# Patient Record
Sex: Female | Born: 1965 | Race: White | Hispanic: No | Marital: Married
Health system: Midwestern US, Academic
[De-identification: ages and names within clinical notes are randomized; demographics above are authoritative.]

---

## 2020-03-10 IMAGING — MR Head^Brain
9 of 10 series · 44 of 48 positions shown · non-contrast
Comparison: none

[Series 2: T1 fat-sat post-contrast · coronal · 3.0mm · 0.82mm/px · 3 of 24 slices shown (1 of 3)]
[im 1/24]
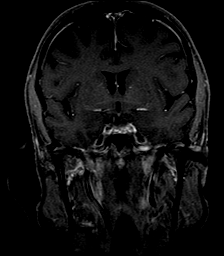
[im 12/24]
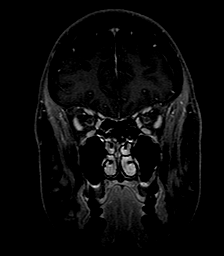
[im 24/24]
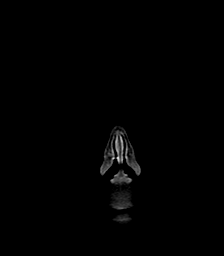

[Series 3: T1 fat-sat post-contrast · axial · 3.0mm · 0.35mm/px · z∈[-26,+26]mm · 3 of 17 slices shown (2 of 3)]
[im 1/17]
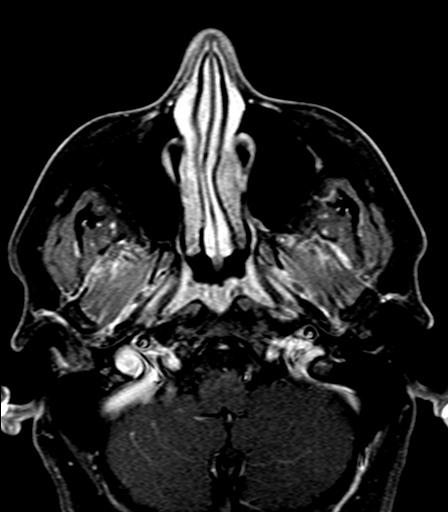
[im 9/17]
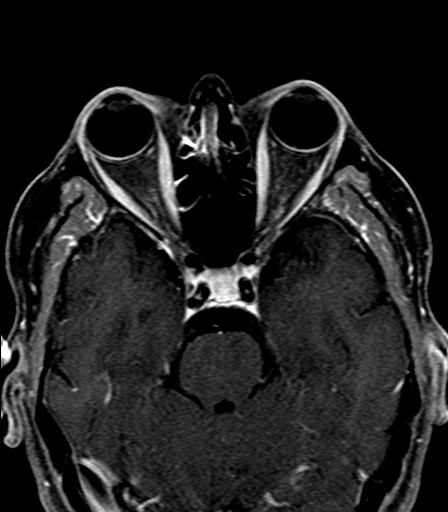
[im 17/17]
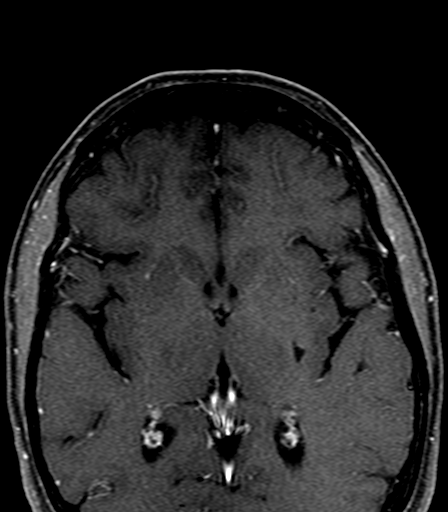

[Series 4: (id)_(person_name)_(person_name) · axial · 1.0mm · 0.39mm/px · z∈[-27,+24]mm · 8 of 52 slices shown]
[im 1/52]
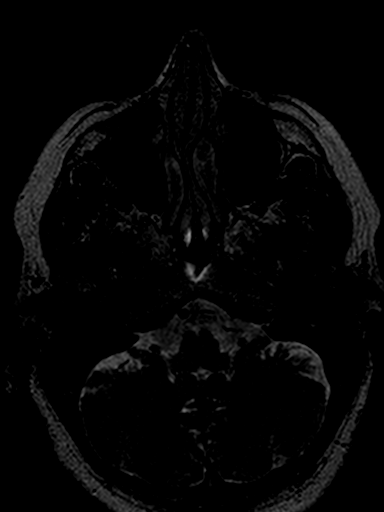
[im 8/52]
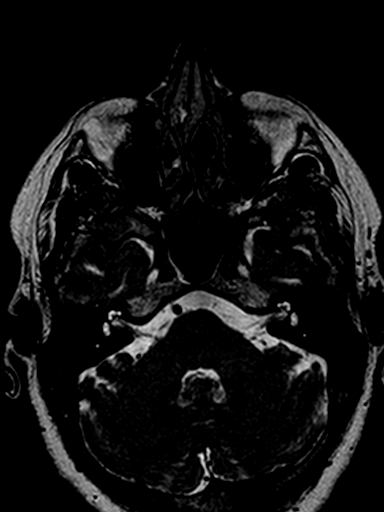
[im 15/52]
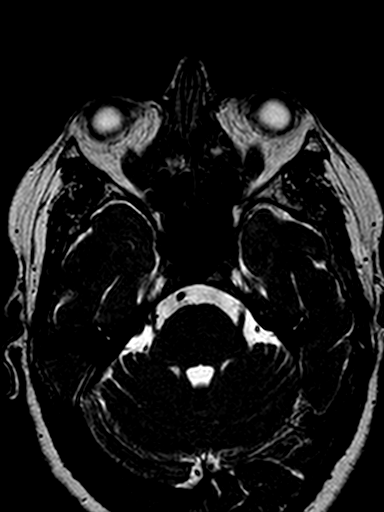
[im 22/52]
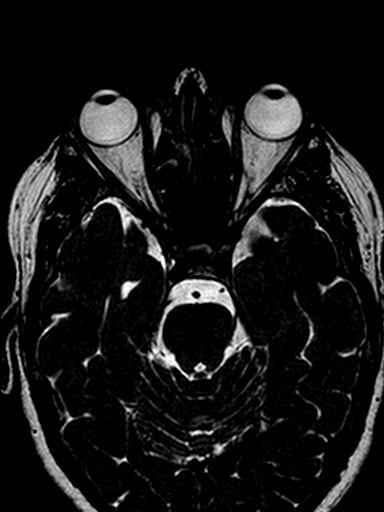
[im 30/52]
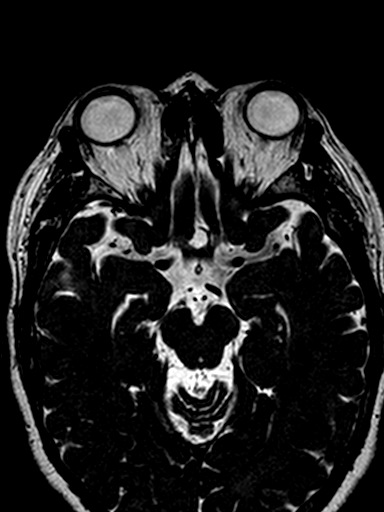
[im 37/52]
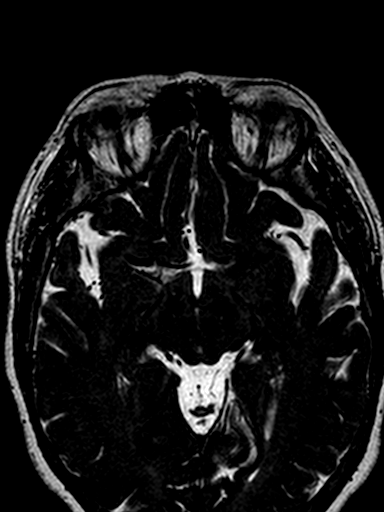
[im 44/52]
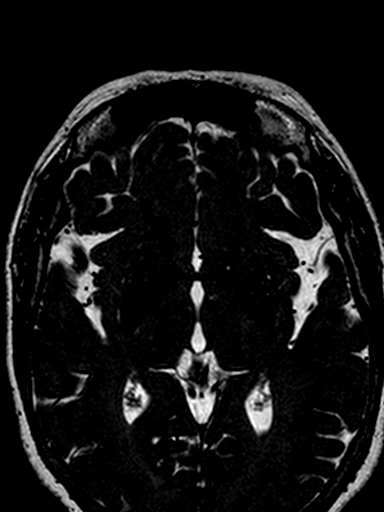
[im 52/52]
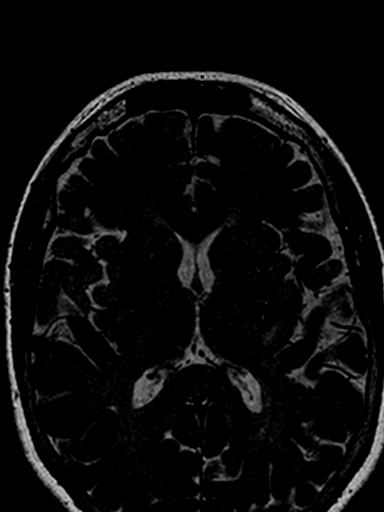

[Series 5: t1_(person_name)_(person_name)_3mm · axial · 3.0mm · 0.35mm/px · z∈[-24,+28]mm · 3 of 17 slices shown]
[im 1/17]
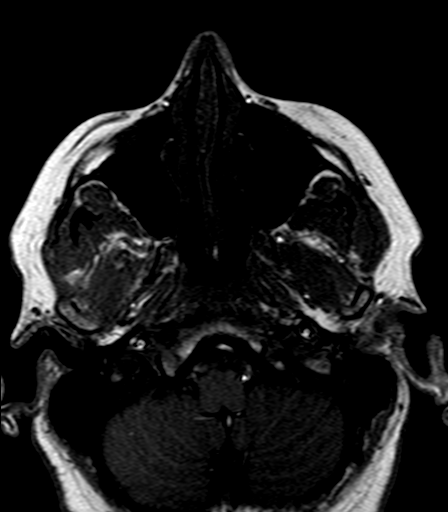
[im 9/17]
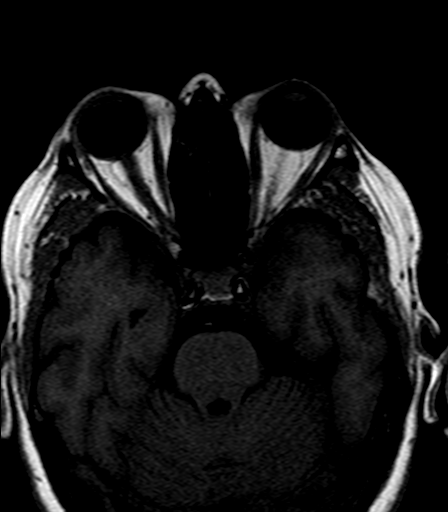
[im 17/17]
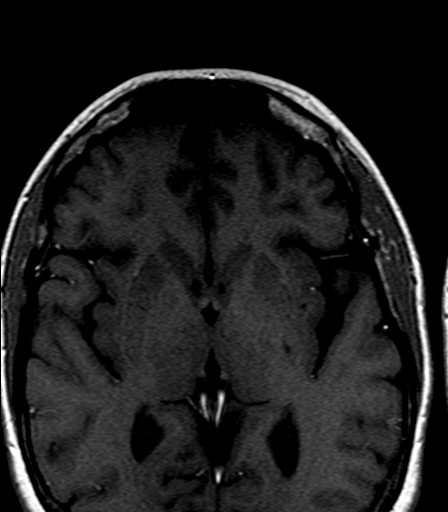

[Series 7: T2 · axial · 5.0mm · 0.72mm/px · z∈[-57,+91]mm · 4 of 24 slices shown]
[im 1/24]
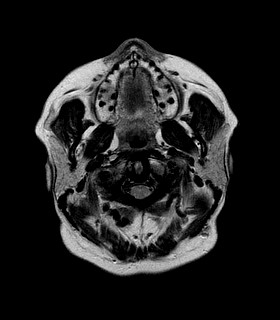
[im 8/24]
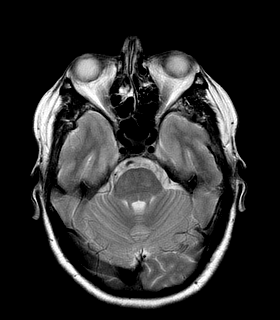
[im 16/24]
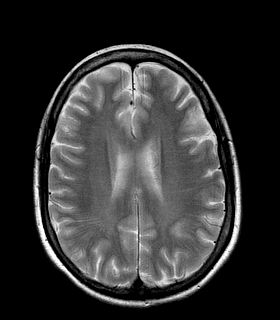
[im 24/24]
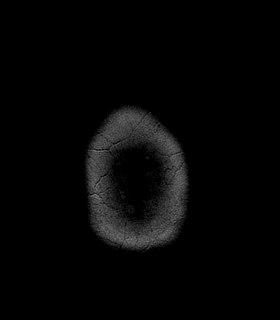

[Series 8: FLAIR · axial · 5.0mm · 0.45mm/px · z∈[-57,+91]mm · 4 of 24 slices shown]
[im 1/24]
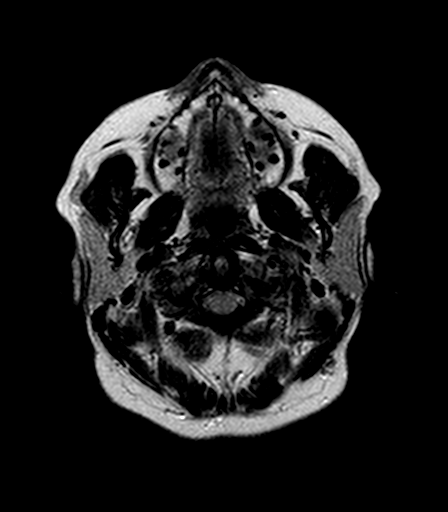
[im 8/24]
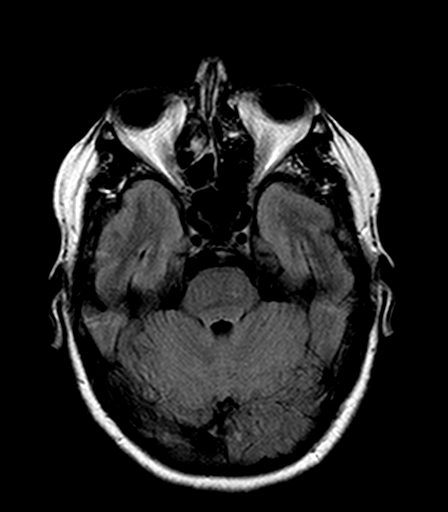
[im 16/24]
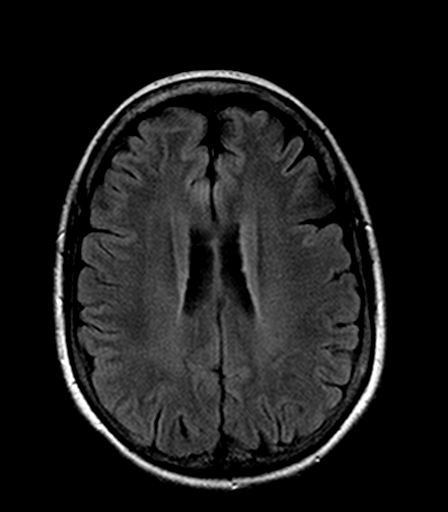
[im 24/24]
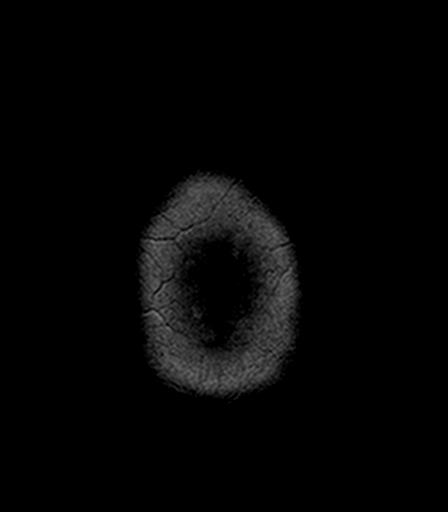

[Series 9: DWI · axial · 5.0mm · 1.80mm/px · z∈[-58,+91]mm · 4 of 24 slices shown (1 of 2)]
[im 1/24]
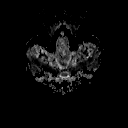
[im 8/24]
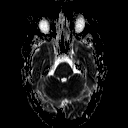
[im 16/24]
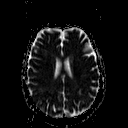
[im 24/24]
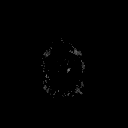

[Series 10: DWI · axial · 5.0mm · 1.80mm/px · z∈[-58,+91]mm · 11 of 70 slices shown (2 of 2)]
[im 1/70]
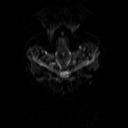
[im 7/70]
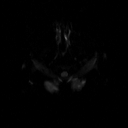
[im 14/70]
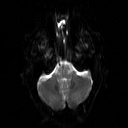
[im 21/70]
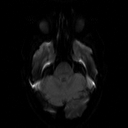
[im 28/70]
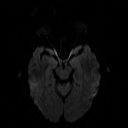
[im 35/70]
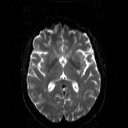
[im 42/70]
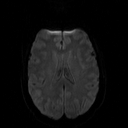
[im 49/70]
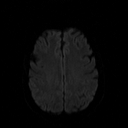
[im 56/70]
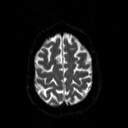
[im 63/70]
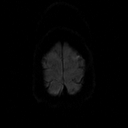
[im 70/70]
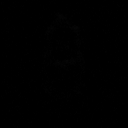

[Series 12: T1 fat-sat post-contrast · axial · 5.0mm · 0.90mm/px · z∈[-57,+91]mm · 4 of 24 slices shown (3 of 3)]
[im 1/24]
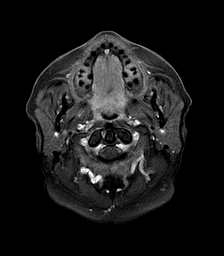
[im 8/24]
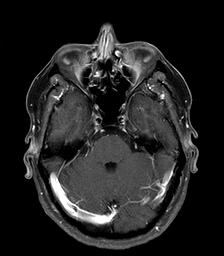
[im 16/24]
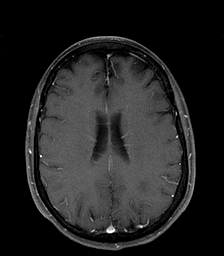
[im 24/24]
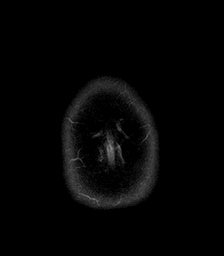

[44 of 48 positions shown; findings below may reference images not displayed]

03/10/20

EXAM

MRI orbits without and with contrast

INDICATION

papillaedema, patient has vision changes, pressure in eyes, bilateral orbits are effected, 15ml
gadavist

FINDINGS

Coronal and sagittal thin section pre and postcontrast T1 weighted images of the orbits were
obtained. Axial thin section T2 weighted images of the orbits were also obtained. Axial T2, FLAIR
and diffusion-weighted images of the brain were obtained. Axial postcontrast T1 weighted images of
the brain were obtained. An IV dose of 15 mL of Gadavist was administered.

The globes appear symmetric. The optic nerves are symmetric. No abnormal enhancement of the optic
nerves is identified.

The extraocular muscles appear symmetric. No retro bulb are mass lesion or focal abnormality is
seen within the orbits.

The optic chiasm is intact.

No enhancing lesions seen within the cerebral or cerebellar hemispheres. No focal abnormality of
the brainstem is identified.

There is no restricted diffusion.

There is moderate mucosal thickening involving the ethmoid sinuses.

IMPRESSION

No significant MRI abnormality orbits is identified.

Tech Notes:

papillaedema, patient has vision changes, pressure in eyes, bilateral orbits are effected, 15ml
gadavist

## 2020-03-10 IMAGING — MR Head^Brain
9 of 10 series · 41 of 48 positions shown · non-contrast
Comparison: none

[Series 2: T1 · sagittal · 5.0mm · 0.45mm/px · 4 of 20 slices shown (1 of 2)]
[im 1/20]
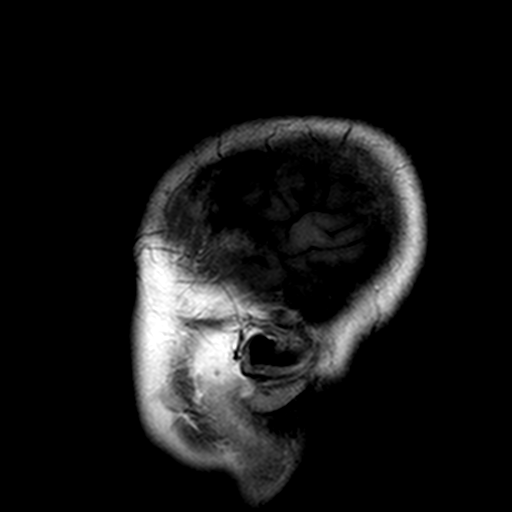
[im 7/20]
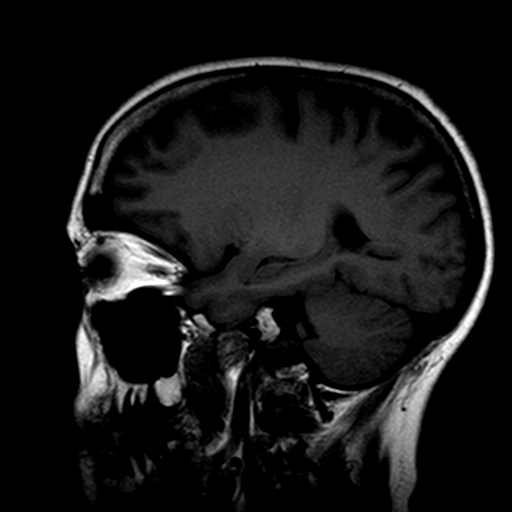
[im 13/20]
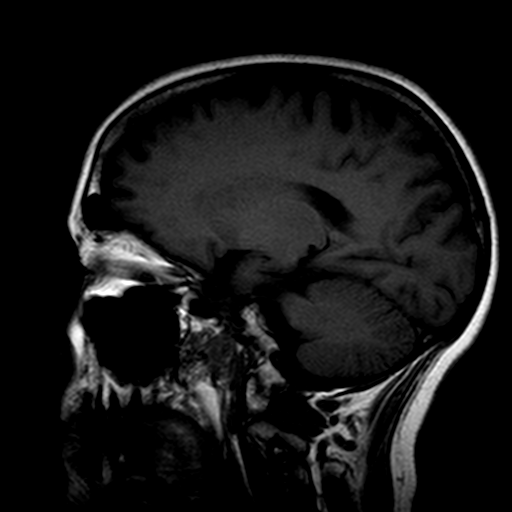
[im 20/20]
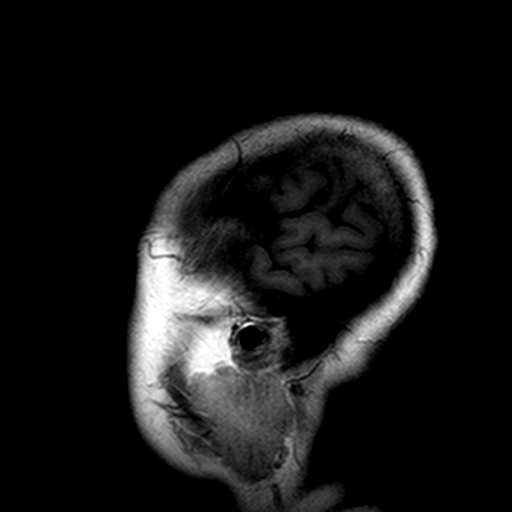

[Series 3: DWI · axial · 5.0mm · 1.80mm/px · z∈[-58,+91]mm · 9 of 70 slices shown (1 of 2)]
[im 1/70]
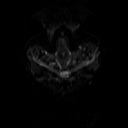
[im 13/70]
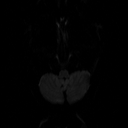
[im 19/70]
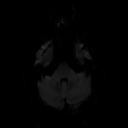
[im 32/70]
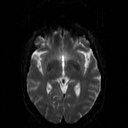
[im 38/70]
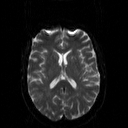
[im 51/70]
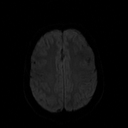
[im 57/70]
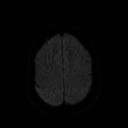
[im 63/70]
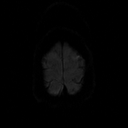
[im 70/70]
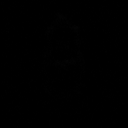

[Series 4: DWI · axial · 5.0mm · 1.80mm/px · z∈[-58,+91]mm · 4 of 24 slices shown (2 of 2)]
[im 1/24]
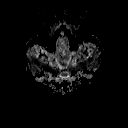
[im 8/24]
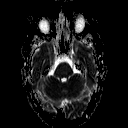
[im 16/24]
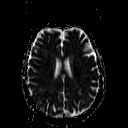
[im 24/24]
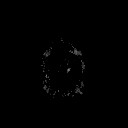

[Series 5: FLAIR · axial · 5.0mm · 0.45mm/px · z∈[-57,+91]mm · 4 of 24 slices shown]
[im 1/24]
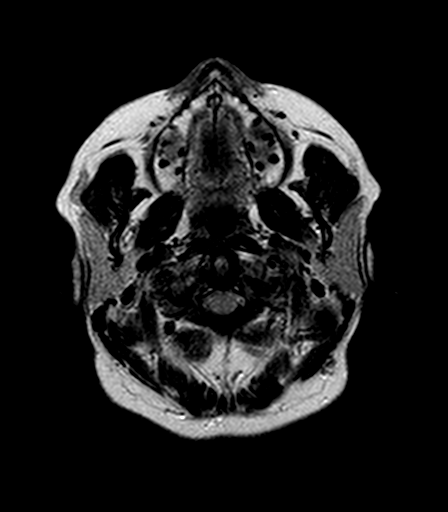
[im 8/24]
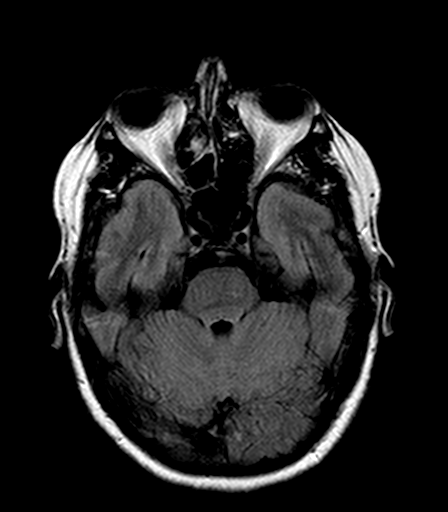
[im 16/24]
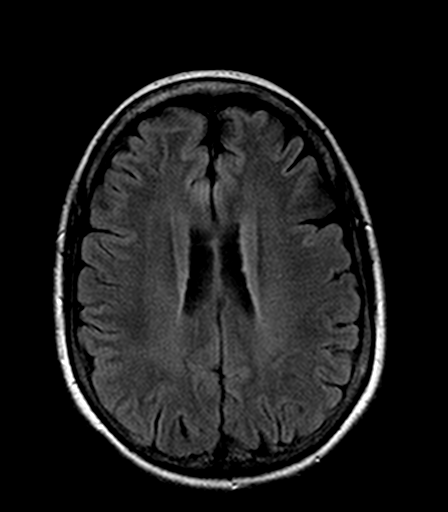
[im 24/24]
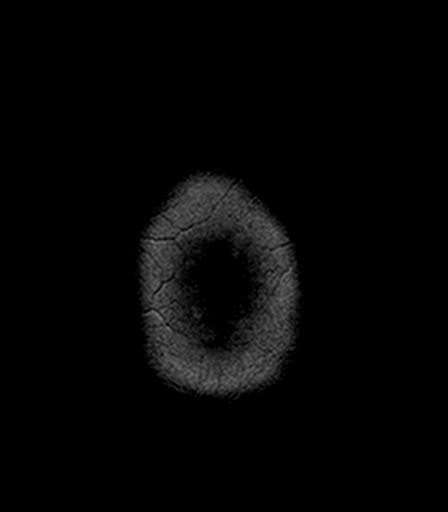

[Series 6: T2 · axial · 5.0mm · 0.72mm/px · z∈[-57,+91]mm · 4 of 24 slices shown]
[im 1/24]
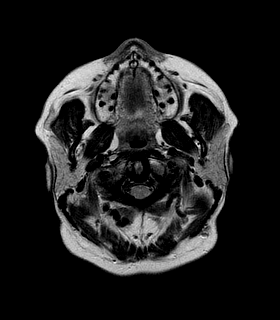
[im 8/24]
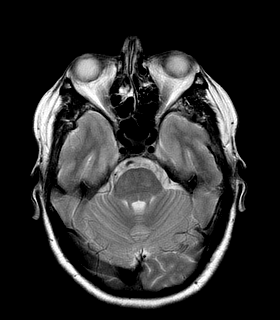
[im 16/24]
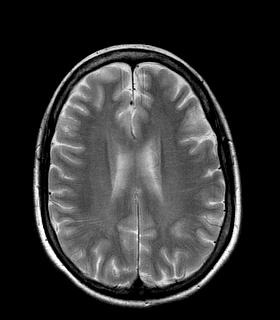
[im 24/24]
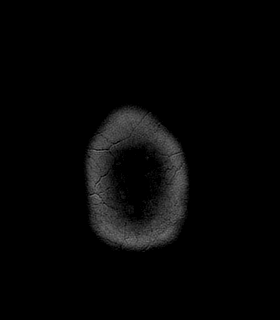

[Series 7: T1 · axial · 5.0mm · 0.45mm/px · z∈[-57,+91]mm · 4 of 24 slices shown (2 of 2)]
[im 1/24]
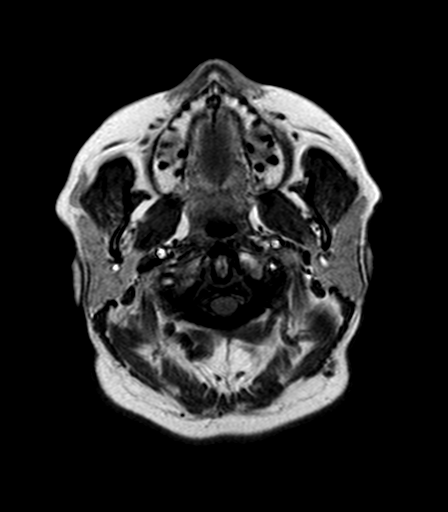
[im 8/24]
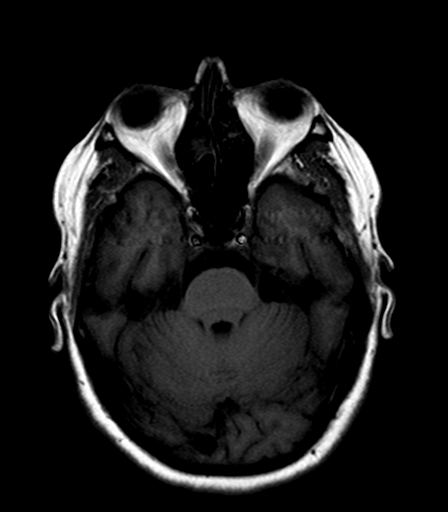
[im 16/24]
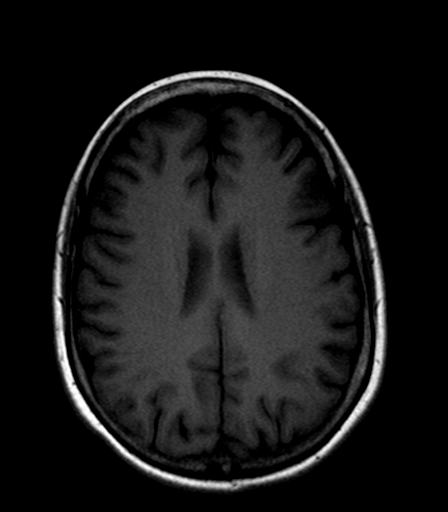
[im 24/24]
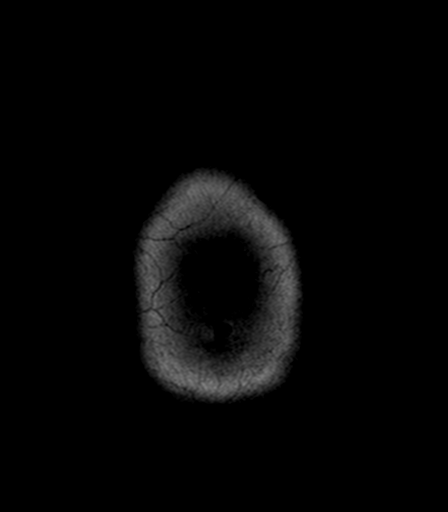

[Series 12: T1 fat-sat post-contrast · axial · 3.0mm · 0.35mm/px · z∈[-26,+26]mm · 3 of 17 slices shown (1 of 3)]
[im 1/17]
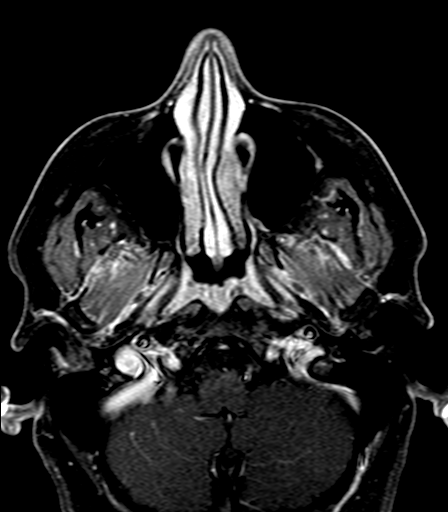
[im 9/17]
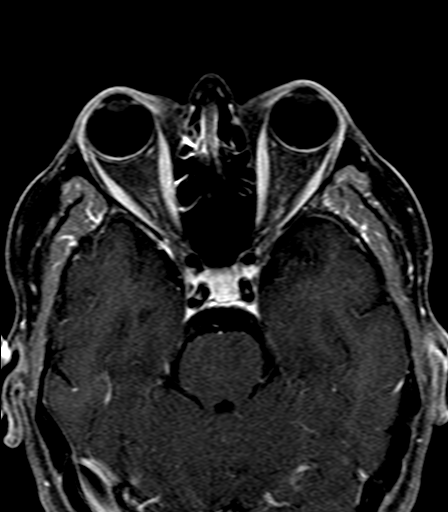
[im 17/17]
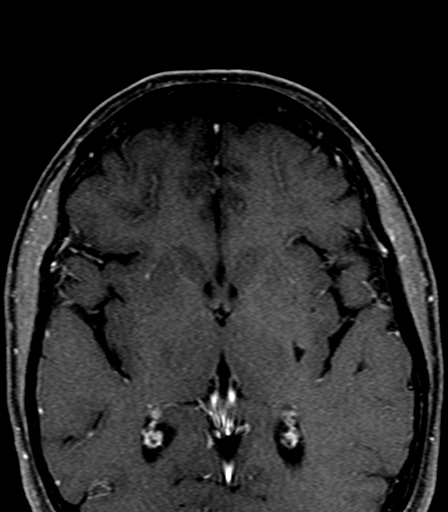

[Series 13: T1 fat-sat post-contrast · coronal · 3.0mm · 0.82mm/px · 4 of 24 slices shown (2 of 3)]
[im 1/24]
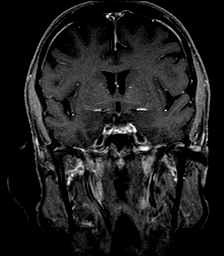
[im 8/24]
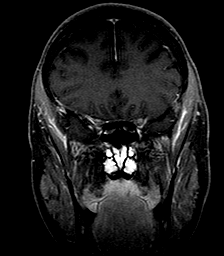
[im 16/24]
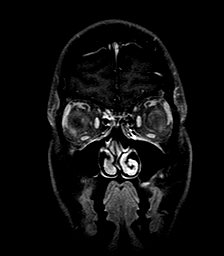
[im 24/24]
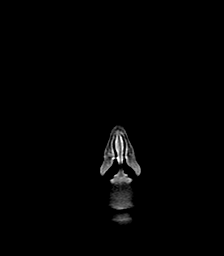

[Series 15: T1 fat-sat post-contrast · coronal · 5.0mm · 0.90mm/px · 5 of 30 slices shown (3 of 3)]
[im 1/30]
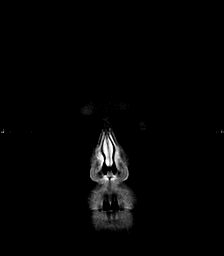
[im 8/30]
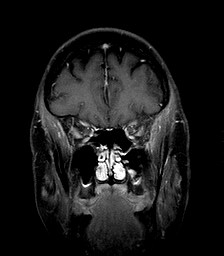
[im 15/30]
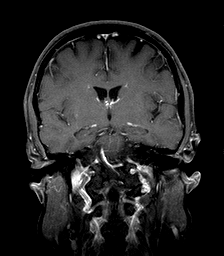
[im 22/30]
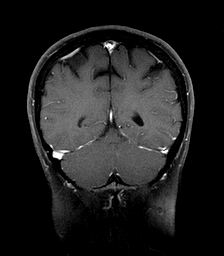
[im 30/30]
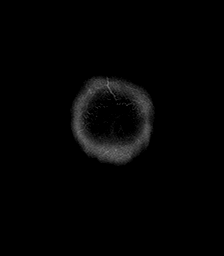

[41 of 48 positions shown; findings below may reference images not displayed]

03/10/20

EXAM

MRI brain without and with contrast

INDICATION

papillaedema, patient has vision changes, pressure in eyes, bilateral orbits are effected, 15ml
gadavist

FINDINGS

Sagittal T1 and axial T1, T2, FLAIR, gradient echo and diffusion weighted images of the brain were
obtained. Axial and coronal post-contrast T1 weighted images were obtained after an IV dose of 15 mL
of Gadavist.

There are two small of increased signal intensity on the FLAIR and T2 weighted images within the
periventricular white matter of the left frontal lobe, each measuring approximately ribs also an
area of increased signal within the left cerebral hemisphere ear along the posterior margin of the
basal ganglia and lateral margin of the left thalamus, measuring 8 x 6 millimeters in diameter.
There is no restricted diffusion. No enhancing lesion is identified.

There is no hemorrhage.

This are broader hemispheres and brainstem appear unremarkable.

There is mild mucosal thickening involving the ethmoid sinuses bilaterally. Measuring 4 x 4
millimeters in diameter. There is adjacent small area of

IMPRESSION

There are foci of signal abnormality within the white matter of the left frontal lobe and along the
posterior margin of the left basal ganglia. Differential diagnostic considerations include chronic
small vessel infarcts. Demyelinating disease could also give this appearance.

No enhancing lesion is identified. There is no acute infarct. There is no hydrocephalus.

There is evidence of mild chronic ethmoid sinusitis.

Tech Notes:

papillaedema, patient has vision changes, pressure in eyes, bilateral orbits are effected, 15ml
gadavist

## 2020-03-15 ENCOUNTER — Encounter: Admit: 2020-03-15 | Discharge: 2020-03-15 | Payer: BC Managed Care – HMO

## 2020-03-15 ENCOUNTER — Inpatient Hospital Stay: Admit: 2020-03-15 | Payer: BC Managed Care – HMO

## 2020-03-15 ENCOUNTER — Inpatient Hospital Stay: Admit: 2020-03-15 | Discharge: 2020-03-15 | Payer: BC Managed Care – HMO

## 2020-03-15 DIAGNOSIS — H539 Unspecified visual disturbance: Secondary | ICD-10-CM

## 2020-03-15 LAB — COMPREHENSIVE METABOLIC PANEL
Lab: 0.6 mg/dL (ref 0.4–1.00)
Lab: 104 mg/dL — ABNORMAL HIGH (ref 70–100)
Lab: 12 mg/dL (ref 7–25)
Lab: 141 MMOL/L (ref 137–147)
Lab: 4.2 MMOL/L (ref <1.0)
Lab: 7.3 g/dL (ref 6.0–8.0)

## 2020-03-15 LAB — CBC AND DIFF
Lab: 0 K/UL (ref 0–0.45)
Lab: 10 K/UL (ref 4.5–11.0)
Lab: 17 % — ABNORMAL LOW (ref <20.7)

## 2020-03-15 LAB — POC GLUCOSE
Lab: 98 mg/dL — ABNORMAL LOW (ref 70–100)
Lab: 120 mg/dL — ABNORMAL HIGH (ref 70–100)

## 2020-03-15 LAB — VITAMIN B12: Lab: 157 pg/mL — ABNORMAL LOW (ref 180–914)

## 2020-03-15 LAB — COVID-19 (SARS-COV-2) PCR

## 2020-03-15 LAB — SED RATE: Lab: 25 mm/h (ref 0–30)

## 2020-03-15 LAB — TSH WITH FREE T4 REFLEX: Lab: 0.9 uU/mL (ref 0.35–5.00)

## 2020-03-15 MED ORDER — SENNOSIDES-DOCUSATE SODIUM 8.6-50 MG PO TAB
1 | Freq: Every day | ORAL | 0 refills | Status: AC | PRN
Start: 2020-03-15 — End: ?

## 2020-03-15 MED ORDER — ACETAMINOPHEN 500 MG PO TAB
500 mg | ORAL | 0 refills | Status: AC | PRN
Start: 2020-03-15 — End: ?
  Administered 2020-03-17: 02:00:00 500 mg via ORAL

## 2020-03-15 MED ORDER — CYANOCOBALAMIN (VITAMIN B-12) 1,000 MCG/ML IJ SOLN
1000 ug | Freq: Every day | INTRAMUSCULAR | 0 refills | Status: AC
Start: 2020-03-15 — End: ?
  Administered 2020-03-16 (×2): 1000 ug via INTRAMUSCULAR

## 2020-03-15 MED ORDER — GADOBENATE DIMEGLUMINE 529 MG/ML (0.1MMOL/0.2ML) IV SOLN
15 mL | Freq: Once | INTRAVENOUS | 0 refills | Status: CP
Start: 2020-03-15 — End: ?
  Administered 2020-03-16: 03:00:00 15 mL via INTRAVENOUS

## 2020-03-15 MED ORDER — ONDANSETRON 4 MG PO TBDI
4 mg | ORAL | 0 refills | Status: AC | PRN
Start: 2020-03-15 — End: ?

## 2020-03-15 MED ORDER — INSULIN ASPART 100 UNIT/ML SC FLEXPEN
0-6 [IU] | Freq: Before meals | SUBCUTANEOUS | 0 refills | Status: AC
Start: 2020-03-15 — End: ?

## 2020-03-15 MED ORDER — MELATONIN 5 MG PO TAB
5 mg | Freq: Every evening | ORAL | 0 refills | Status: AC | PRN
Start: 2020-03-15 — End: ?

## 2020-03-15 MED ORDER — BUPROPION XL 150 MG PO TB24
150 mg | Freq: Every day | ORAL | 0 refills | Status: AC
Start: 2020-03-15 — End: ?
  Administered 2020-03-16: 16:00:00 150 mg via ORAL

## 2020-03-15 MED ORDER — ROSUVASTATIN 10 MG PO TAB
10 mg | Freq: Every day | ORAL | 0 refills | Status: AC
Start: 2020-03-15 — End: ?
  Administered 2020-03-16: 16:00:00 10 mg via ORAL

## 2020-03-15 MED ORDER — POLYETHYLENE GLYCOL 3350 17 GRAM PO PWPK
1 | Freq: Every day | ORAL | 0 refills | Status: AC | PRN
Start: 2020-03-15 — End: ?

## 2020-03-15 MED ORDER — ONDANSETRON HCL (PF) 4 MG/2 ML IJ SOLN
4 mg | INTRAVENOUS | 0 refills | Status: AC | PRN
Start: 2020-03-15 — End: ?

## 2020-03-15 NOTE — ED Provider Notes
Heather Rollins is a 55 y.o. female.    Chief Complaint:  Chief Complaint   Patient presents with   ? Eye Problem     visual disturbances for 9 days. Seen by PCP and had MRI done. Results showed nerve problems around orical and fluids under both eyes primarily R eye. Over the weekend vision in bilateral eyes became worse       History of Present Illness:  Heather Rollins is a 55 y.o. 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 Of note, patient had minimally blurry vision in bilateral eyes 9 days ago that she initially attributed to her sinus congestion; however, patient reports that 6 days ago, she noticed onset of a horizontal line in vision of the right eye, with blurred vision in the lower lower half with an associated eye pressure/ discomfort pain to the right eye, minimal discomfort in the left eye, denies decreased vision in left eye at this point. Patient reports that she then presented to her PCP 5 days ago where she had an MRI and lab work completed, patient advised that there were remarkable findings in regards to her bilateral optic nerves and was advised to present to ED for further evaluation. Per medical record review, patient additionally presented to ophthalmology for further evaluation, where she had OCT and fundus photo imagining completed, remarkable findings noted in patient's right macula and bilateral optic nerves.    Patient reports persisting altered vision in right eye, denies worsening vision, and reports that vision is still blurry under the horizontal line, patient notes that she has baseline and clear vision in her left eye, but reports that vision in her left eye appears more dim today, she notes that her left eye seems more strained with an increased twitch over the last 4 days. Patient denies pain in bilateral eyes at this time and denies andy drainage in bilateral eyes. Patient notes that prior to onset 9 days ago she was reading at baseline, states that reading is now more difficult secondary to right eye's decreased vision, patient denies wearing prescribed corrective lenses at this time, and reports that she wears reading glasses PRN. Patient reports that her latest A1C was around 6.4, denies insulin control, and notes that she takes Metformin BID and Ozempic. Patient denies family history of MS. Patient reports surgical history of hysterectomy (date note specified), denies any other pertinent medical diagnosis and procedures, and denies any surgical procedures or prior diagnosis in bilateral eyes. Patient has no other acute medical complaints at this time.      History provided by:  Patient and medical records  Language interpreter used: No        Review of Systems:  Review of Systems   Constitutional: Negative for fever.   HENT: Negative for sore throat.    Eyes: Positive for visual disturbance (OD>OS. Horizontal line with blurred vision inferior to line in OD. Dimmed, but clear vision in OS.). Negative for pain and discharge.   Respiratory: Negative for shortness of breath.    Cardiovascular: Negative for chest pain.   Gastrointestinal: Negative for abdominal pain, nausea and vomiting.   Genitourinary: Negative for dysuria.   Musculoskeletal: Negative for back pain.   Skin: Negative for rash.   Neurological: Negative for headaches.       Allergies:  Patient has no known allergies.    Past Medical History:  No past medical history on file.  Past Surgical History:  No past surgical history on file.    Pertinent medical/surgical history reviewed  No past medical history on file.  No past surgical history on file.    Social History:  Social History     Tobacco Use   ? Smoking status: Former Smoker   ? Smokeless tobacco: Not on file   Substance Use Topics   ? Alcohol use: Not Currently   ? Drug use: Never     Social History     Substance and Sexual Activity   Drug Use Never             Family History:  No family history on file.    Vitals:  ED Vitals    Date and Time T BP P RR SPO2P SPO2 User   03/15/20 1111 36.5 ?C (97.7 ?F) 127/85 85 17 PER MINUTE -- 100 % JC          Physical Exam:  Physical Exam  Vitals and nursing note reviewed.   Constitutional:       Appearance: Normal appearance. She is well-developed.   HENT:      Head: Normocephalic and atraumatic.      Right Ear: External ear normal.      Left Ear: External ear normal.      Nose: Nose normal.      Mouth/Throat:      Mouth: Mucous membranes are moist.      Pharynx: Oropharynx is clear.   Eyes:      Extraocular Movements: Extraocular movements intact.      Conjunctiva/sclera: Conjunctivae normal.      Pupils: Pupils are equal, round, and reactive to light.   Neck:      Trachea: No tracheal deviation.   Cardiovascular:      Rate and Rhythm: Normal rate and regular rhythm.      Pulses: Normal pulses.      Heart sounds: Normal heart sounds.   Pulmonary:      Effort: Pulmonary effort is normal.      Breath sounds: Normal breath sounds.   Abdominal:      Palpations: Abdomen is soft.      Tenderness: There is no abdominal tenderness. There is no guarding.   Musculoskeletal:         General: No deformity.      Cervical back: Normal range of motion and neck supple.   Skin:     General: Skin is warm and dry.   Neurological:      Mental Status: She is alert and oriented to person, place, and time.   Psychiatric:         Behavior: Behavior normal.         Laboratory Results:  Labs Reviewed   CBC AND DIFF - Abnormal       Result Value Ref Range Status    White Blood Cells 10.8  4.5 - 11.0 K/UL Final    RBC 4.99  4.0 - 5.0 M/UL Final    Hemoglobin 14.2  12.0 - 15.0 GM/DL Final    Hematocrit 45.4  36 - 45 % Final    MCV 84.7  80 - 100 FL Final    MCH 28.4  26 - 34 PG Final    MCHC 33.6  32.0 - 36.0 G/DL Final    RDW 09.8  11 - 15 % Final    Platelet Count 277  150 - 400 K/UL Final    MPV 9.0  7 -  11 FL Final    Neutrophils 65  41 - 77 % Final    Lymphocytes 27  24 - 44 % Final    Monocytes 6  4 - 12 % Final    Eosinophils 1  0 - 5 % Final    Basophils 1  0 - 2 % Final    Absolute Neutrophil Count 7.10 (*) 1.8 - 7.0 K/UL Final    Absolute Lymph Count 2.91  1.0 - 4.8 K/UL Final    Absolute Monocyte Count 0.60  0 - 0.80 K/UL Final    Absolute Eosinophil Count 0.08  0 - 0.45 K/UL Final    Absolute Basophil Count 0.06  0 - 0.20 K/UL Final    MDW (Monocyte Distribution Width) 17.1  <20.7 Final   COVID-19 (SARS-COV-2) PCR   SED RATE    Sed Rate -ESR 25  0 - 30 MM/HR Final   COMPREHENSIVE METABOLIC PANEL   C REACTIVE PROTEIN (CRP)          Radiology Interpretation:                      EKG:  n/a    ED Course:  Patient with history of diabetes, first time at this facility.  She presented from outpatient provider for concerns of possible demyelination on MRI, and macula edema.  Neurology, ophthalmology was consulted in the ED, basic labs also obtained.  Neurology recommended admission for evaluation of possible demyelination, at this time they recommend repeat MRI of the head, orbits, and C-spine.  These were ordered, and internal medicine was contacted for admission due to no space on the inpatient neurology team.  Ophthalmology is currently evaluating the patient and will provide final recommendations.    MDM  Reviewed: nursing note and vitals  Interpretation: labs  Consults: Admitting Provider, Neurology and Ophthalmology        Facility Administered Meds:  Medications - No data to display      Clinical Impression:  Clinical Impression   Vision changes       Disposition/Follow up  ED Disposition     ED Disposition    Admit        No follow-up provider specified.    Medications:  New Prescriptions    No medications on file       Procedure Notes:  Procedures        Attestation / Supervision:  I, McKenzee Remmers, am scribing for and in the presence of Cleotilde Neer, DO.      McKenzee Remmers    I, Cleotilde Neer, DO, MBA personally performed the services described in this documentation as scribed and it is both accurate and complete.

## 2020-03-15 NOTE — ED Notes
Pt presents to [facility] vision problem that has been ongoing for the last 8 days. Pt reports she has a "horizointal line" that she can see above for her R eye and her L eye is becoming more and more blurry. Pt did have an MRI done last Tuesday which showed some nerve damage and fluid build up under the R eye. Pt is a/o x4, speaking in complete sentences. Ambulatory. PWD. Denies any new pain at this time.     Belongings: purse, phone, shirt, pants, shoes.

## 2020-03-15 NOTE — Consults
Neurology Consult Note      Admission Date: 03/15/2020                                                LOS: 0 days    Reason for Consult:  Bilateral vision change    Consult type: Opinion with orders    Assessment  Heather Rollins is a 33F with a PMH of DMII who presents with progressive R eye vision loss and new L eye visoin change for which neurology is consulted.    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 Differential includes infectious (syphilis, TB, lyme, etc) vs autoimmune/inflammatory (sarcoid, sjogren, SLE, demyelinating) vs ischemic (AION) vs nutritional (B12, B1, folate deficiency) vs less likely inherited given no family history.    Recommendations  >Repeat MRI brain WWO, MRI orbit WWO  >Would add MRI C spine WWO and MRV head  >Based on above studies, will likely need LP in next day or two  >Serology -> B12, B1, folate, A1C, CRP (pending), ANA, SSA/SSB, anti-dsDNA, syphilis Ab, TB, HIV  >Agree with ophtho involvement  >Neurology will continue to follow    Please call us at [phone] if you have any questions    Patient seen and discussed with Dr. Lorrin Goodell, MD  Neurology, PGY-4  _____________________________________________________________________    History of Present Illness:     Heather Rollins is a 33F with a PMH of DMII who presents with progressive R eye vision loss and new L eye visoin change for which neurology is consulted.    Patient states that her vision change started 1-2 weeks ago. She states she first thought she was having some sinus changes causing her R eye vision change, but states it continued to progress over the next several days to the point where she has a central vision blurring that has obscured her vision. She states that she never really had any significant R eye pain, but has had some discomfort. She states over the last day or so she has had some new changes in her L eye. They are not as pronounced as her R, but she feels like the vision has changed slightly. She states that she chronically has some jaw tightness at night, but denies any jaw cramping or temporal tenderness. She denies any current weakness or numbness. She denies double vision, facial weakness, dysphagia, speech difficulty.     She states she went to an ophthalmologist as an outpatient and had MRI brain and orbit completed. She denies any family history of vision loss or neurologic conditions like Heather. She is a previous smoker. She denies any current alcohol use. She denies recreational drug use.     No past medical history on file.  No past surgical history on file.  Social History     Tobacco Use   ? Smoking status: Former Smoker   ? Smokeless  tobacco: Not on file   Substance Use Topics   ? Alcohol use: Not Currently   ? Drug use: Never     No family history on file.  Allergies:  Patient has no known allergies.    Scheduled Meds:Continuous Infusions:  PRN and Respiratory Meds:    Review of Systems:  A 14 point review of systems was negative except for: vision change  Vital Signs:  Last Filed in 24 hours Vital Signs:  24 hour Range    BP: 127/85 (03/14 1111)  Temp: 36.5 ?C (97.7 ?F) (03/14 1111)  Pulse: 85 (03/14 1111)  Respirations: 17 PER MINUTE (03/14 1111)  SpO2: 100 % (03/14 1111)  Height: 160 cm (5' 3) (03/14 1111) BP: (127)/(85)   Temp:  [36.5 ?C (97.7 ?F)]   Pulse:  [85]   Respirations:  [17 PER MINUTE]   SpO2:  [100 %]      General physical exam:    HEENT: normocephalic, eyes open with no discharge, nares patent, oropharynx is clear with no lesions, palate intact  Fundoscopic exam: blurring of discs bilaterally  CV: well perfused  Chest: normal configuration, lungs are clear bilaterally  Ab: soft, non-tender, no masses, no organomegaly  Skin: no rashes or lesions    Neuro exam:   Mental status: alert, oriented to person/place/time  Speech:    Normal Abnormal   Fluency x    Comprehension x    Articulation x    Repetition     Naming         Cranial Nerves:    Normal Abnormal   II visual fields normal Pupils dilated (chemically), reported R APD from ophtho   III, IV, VI EOMI, no nystagmus    V Sensation nml V1-V3    VII       Normal facial symmetry    VIII nml to voice    IX, X Symmetric palate elevation    XI Equal shoulder shrug    XII Tongue midline        Muscle/motor:   Tone: nml  Bulk: nml  Fasciculations: none  Pronator drift: none     NF NE SA EF EE WE WF FF FE FA TA HF HA HE KF KE DF PF In Ev TF TE   R   5 5 5 5 5 5 5 5  5   5 5 5 5        L   5 5 5 5 5 5 5 5  5   5 5 5 5            Sensation:    Normal RUE LUE RLE LLE   Light Touch x       Pin Prick        Temperature        Vibration        Proprioception            Coordination:    Normal Abnormal Right Abnormal Left   Finger to Nose x     Rapid alternating       Heel to Shin x     Finger tap      Foot tap      Other        Gait and Sation:  Regular gait: deferred    Reflexes:    Right Left   Triceps 2 2   Biceps 2 2   Brachioradialis 2 2   Patella 2 2   Ankle 2  2   Plantar           Lab/Radiology/Other Diagnostic Tests:  24-hour labs:    Results for orders placed or performed during the hospital encounter of 03/15/20 (from the past 24 hour(s))   CBC AND DIFF    Collection Time: 03/15/20  1:08 PM   Result Value Ref Range    White Blood Cells 10.8 4.5 - 11.0 K/UL    RBC 4.99 4.0 - 5.0 M/UL    Hemoglobin 14.2 12.0 - 15.0 GM/DL    Hematocrit 45.4 36 - 45 %    MCV 84.7 80 - 100 FL    MCH 28.4 26 - 34 PG    MCHC 33.6 32.0 - 36.0 G/DL    RDW 09.8 11 - 15 %    Platelet Count 277 150 - 400 K/UL    MPV 9.0 7 - 11 FL    Neutrophils 65 41 - 77 %    Lymphocytes 27 24 - 44 %    Monocytes 6 4 - 12 %    Eosinophils 1 0 - 5 %    Basophils 1 0 - 2 %    Absolute Neutrophil Count 7.10 (H) 1.8 - 7.0 K/UL    Absolute Lymph Count 2.91 1.0 - 4.8 K/UL    Absolute Monocyte Count 0.60 0 - 0.80 K/UL    Absolute Eosinophil Count 0.08 0 - 0.45 K/UL    Absolute Basophil Count 0.06 0 - 0.20 K/UL    MDW (Monocyte Distribution Width) 17.1 <20.7   COMPREHENSIVE METABOLIC PANEL    Collection Time: 03/15/20  1:08 PM   Result Value Ref Range    Sodium 141 137 - 147 MMOL/L    Potassium 4.2 3.5 - 5.1 MMOL/L    Chloride 104 98 - 110 MMOL/L    Glucose 104 (H) 70 - 100 MG/DL    Blood Urea Nitrogen 12 7 - 25 MG/DL    Creatinine 1.19 0.4 - 1.00 MG/DL    Calcium 14.7 8.5 - 82.9 MG/DL    Total Protein 7.3 6.0 - 8.0 G/DL    Total Bilirubin 0.5 0.3 - 1.2 MG/DL    Albumin 4.7 3.5 - 5.0 G/DL    Alk Phosphatase 64 25 - 110 U/L    AST (SGOT) 17 7 - 40 U/L    CO2 25 21 - 30 MMOL/L    ALT (SGPT) 20 7 - 56 U/L    Anion Gap 12 3 - 12    eGFR >60 >60 mL/min   SED RATE    Collection Time: 03/15/20  1:08 PM   Result Value Ref Range    Sed Rate -ESR 25 0 - 30 MM/HR         Orson Eva. Celene Kras, MD

## 2020-03-16 ENCOUNTER — Encounter: Admit: 2020-03-16 | Discharge: 2020-03-16 | Payer: BC Managed Care – HMO

## 2020-03-16 ENCOUNTER — Inpatient Hospital Stay: Admit: 2020-03-16 | Discharge: 2020-03-16 | Payer: BC Managed Care – HMO

## 2020-03-16 MED ADMIN — LORAZEPAM 2 MG/ML IJ SYRG [86485]: 0.5 mg | INTRAVENOUS | @ 16:00:00 | Stop: 2020-03-16 | NDC 00409198503

## 2020-03-17 ENCOUNTER — Encounter: Admit: 2020-03-17 | Discharge: 2020-03-17 | Payer: BC Managed Care – HMO

## 2020-03-17 DIAGNOSIS — E785 Hyperlipidemia, unspecified: Secondary | ICD-10-CM

## 2020-03-17 MED ADMIN — SODIUM CHLORIDE 0.9 % IJ SOLN [7319]: 50 mL | INTRAVENOUS | @ 01:00:00 | Stop: 2020-03-17 | NDC 00409488820

## 2020-03-17 MED ADMIN — IOHEXOL 350 MG IODINE/ML IV SOLN [81210]: 75 mL | INTRAVENOUS | @ 01:00:00 | Stop: 2020-03-17 | NDC 00407141491

## 2020-03-18 ENCOUNTER — Encounter: Admit: 2020-03-18 | Discharge: 2020-03-18 | Payer: BC Managed Care – HMO

## 2020-03-31 ENCOUNTER — Ambulatory Visit: Admit: 2020-03-31 | Discharge: 2020-03-31 | Payer: BC Managed Care – HMO

## 2020-03-31 ENCOUNTER — Encounter: Admit: 2020-03-31 | Discharge: 2020-03-31 | Payer: BC Managed Care – HMO

## 2020-03-31 DIAGNOSIS — H47013 Ischemic optic neuropathy, bilateral: Secondary | ICD-10-CM

## 2020-03-31 DIAGNOSIS — H539 Unspecified visual disturbance: Secondary | ICD-10-CM

## 2020-03-31 DIAGNOSIS — E119 Type 2 diabetes mellitus without complications: Secondary | ICD-10-CM

## 2020-03-31 DIAGNOSIS — E785 Hyperlipidemia, unspecified: Secondary | ICD-10-CM

## 2020-03-31 LAB — CBC AND DIFF
Lab: 0 K/UL (ref 0–0.20)
Lab: 0.1 K/UL (ref 0–0.45)
Lab: 0.4 K/UL (ref 0–0.80)
Lab: 1 % (ref 0–2)
Lab: 1 % (ref 0–5)
Lab: 13 g/dL (ref 12.0–15.0)
Lab: 14 % (ref 11–15)
Lab: 2.3 K/UL (ref 1.0–4.8)
Lab: 27 % (ref 24–44)
Lab: 275 K/UL (ref 150–400)
Lab: 33 g/dL (ref 32.0–36.0)
Lab: 39 % (ref 36–45)
Lab: 4.6 M/UL (ref 4.0–5.0)
Lab: 5.8 K/UL (ref 1.8–7.0)
Lab: 66 % (ref 41–77)
Lab: 8.4 FL (ref 7–11)
Lab: 8.7 K/UL (ref 4.5–11.0)

## 2020-03-31 LAB — HOMOCYSTEINE: Lab: 8.9 umol/L (ref 5–15)

## 2020-03-31 LAB — CARDIOLIPIN AB IGG/IGM: Lab: 0.5 [MPL'U]/mL (ref <20.0)

## 2020-03-31 LAB — BETA 2 GLYCOPROTEIN 1 AB, IGG

## 2020-03-31 LAB — BETA 2 GLYCOPROTEIN 1 AB, IGM: Lab: 0.8 U/mL (ref <20.0)

## 2020-03-31 MED ORDER — PREDNISONE 10 MG PO TAB
60 mg | ORAL_TABLET | Freq: Every day | ORAL | 1 refills | Status: AC
Start: 2020-03-31 — End: ?

## 2020-03-31 MED ORDER — FLUORESCEIN 500 MG/2 ML (25 %) IV SOLN
2 mL | Freq: Once | INTRAVENOUS | 0 refills | Status: CP
Start: 2020-03-31 — End: ?
  Administered 2020-03-31: 16:00:00 2 mL via INTRAVENOUS

## 2020-03-31 NOTE — Progress Notes
There is no height or weight on file to calculate BMI.      VISUAL FIELD, EXTEND           [facility] Automated exam type was used.     Right Eye  Threshold was 24-2. Strategy was SITA Standard. Reliability was poor. Findings location Superior, Inferior, Right, Left Generalized Contraction, [place]     Left Eye  Threshold was 24-2. Strategy was SITA Standard. Reliability was poor. Findings location Superior Arcuate Defect     Notes  OD: generalized visual field loss with some sparing centrally  OS: Dense superior defect           SCAN COMP OPHTHAL DIAG IMG, OP NERVE           Optic Nerve  Right Eye  Optic nerve findings: Disc Edema Progression has no prior data.     Left Eye  Optic nerve findings: Disc Edema Progression has no prior data.     Notes  OCT- macula:    OD: nasal outer retinal layer loss and RPE loss  OS: sub-foveal out retinal layer disruption           FLUORESCEIN ANGIOGRAPHY W/INTRP AND RPT, OU-BOTH EYES           Time Out  Confirmed correct patient, procedure, site, and patient consented.     Anesthesia  Right Eye  No anesthesia was used.     Left Eye  No anesthesia was used.     Procedure  Right Eye  A 25 gague needle was used. Fluorescein 500 mg/52mL was given intravenously for this procedure and documented on the [facility] separately.     Left Eye  A 25 gague needle was used. Fluorescein 500 mg/19mL was given intravenously for this procedure and documented on the [facility] separately.     Post-op  Right Eye  The patient tolerated the procedure well. There were no complications.     Left Eye  The patient tolerated the procedure well. There were no complications.     Notes  Early, mid and late transits of the left eye showed patchy choroidal filling consistent with ischemic vascular cause.  Both optic nerves showed late leakage/staining consistent with ischemic cause.                  Assessment and Plan:        This pleasant lady is here for f/up from her earlier hospitalization for central blind spot OD beginning around 03/09/20.  She was seen in the [facility] clinic, found to have bilateral disc swelling, OD>OS, and macular edema.  She was sent for MRI showing white matter disease and sent to the ED where she was admitted to neurology.  She had repeat MRI/MRV, serologic screening for other optic neuropathies, and LP.  She was found to have B12 deficiency and was started on supplements.  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  ESR/CRP, HIV, FTAabs, TSH folate all normal.  LP opening pressure was 15, CSF normal aside from elevated glucose; A1c was 6.5.  ACE, NMO/MOG abs, IL-2 receptor, TB pending at time of discharge.  Since discharge on 3/16, NMO/MOG abs normal; IL-2 receptor normal; ACE not performed; TB spot negative.    Today, her visual acuity is 20/600 in the right eye and 20/50 in the left eye with a right afferent pupillary defect.  Her muscle/motility examination is unremarkable.  On anterior segment examination, she has corneal guttata and  trace nuclear sclerosis.  Posterior segment examination demonstrates optic nerve edema with flame shaped hemorrhages, left worse than right. OCT-n demonstrates significant edema in both eyes.  OCT-m demonstrates nasal outer retinal layer loss with subretinal fluid in the right eye and sub foveal outer retinal layer disruption with subretinal fluid in the left eye.  Fluorescein angiogram demonstrates staining of both optic nerves, micro aneurysms, and patchy choroidal filling in the left eye.  Visual field testing revealed an superior altitudinal defect OS and generalized depression OD.    In summary, the patient has decreased vision in both eyes, a right afferent pupillary defect, optic nerve edema with flame shaped hemorrhages OS/pallid edema OD, and patchy choroidal filling on fluorescein angiogram.  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  She has had a negative infectious workup, but has not had ACE ordered.  We recommend a hypercoagulability workup with antiphospholipid antibodies, factor V, factor 8, prothrombin, antithrombin, plus ACE .   Additionally, we offered a trial of oral steroids at 60mg  daily for ten days with an oral PPI, to try to improve her vision loss based on small case series published by Dr. Kathie Rhodes. Hayreh.  Due to her diabetes, we recommend her primary care provider follow her glucose levels closely while on the steroids.  We also recommend she continue her B12 supplementation, and obtain polysomnogram due to her loud snoring and the association of OSA with ischemic optic neuropathy.     We will follow up with her in 10 days.  IF she has noted improvement with the steroids, we'll due a long taper but if there is no improvement, we'll quickly taper off the steroids.  In either event, we'll likely arrange low vision consultation/occupational therapy.     Neill Loft, PGY-3    ATTESTATION    I personally performed the key portions of the E/M visit, discussed case with resident and concur with resident documentation of history, physical exam, assessment, and treatment plan unless otherwise noted.    Staff name:  Malon Kindle, MD Date:  03/31/2020

## 2020-04-06 ENCOUNTER — Encounter: Admit: 2020-04-06 | Discharge: 2020-04-06 | Payer: BC Managed Care – HMO

## 2020-04-09 ENCOUNTER — Encounter: Admit: 2020-04-09 | Discharge: 2020-04-09 | Payer: BC Managed Care – HMO

## 2020-04-09 ENCOUNTER — Ambulatory Visit: Admit: 2020-04-09 | Discharge: 2020-04-09 | Payer: BC Managed Care – HMO

## 2020-04-09 DIAGNOSIS — H00022 Hordeolum internum right lower eyelid: Secondary | ICD-10-CM

## 2020-04-09 DIAGNOSIS — H47013 Ischemic optic neuropathy, bilateral: Secondary | ICD-10-CM

## 2020-04-09 DIAGNOSIS — E119 Type 2 diabetes mellitus without complications: Secondary | ICD-10-CM

## 2020-04-09 DIAGNOSIS — E785 Hyperlipidemia, unspecified: Secondary | ICD-10-CM

## 2020-04-09 MED ORDER — PREDNISONE 10 MG PO TAB
ORAL_TABLET | Freq: Every day | 0 refills | Status: AC
Start: 2020-04-09 — End: ?

## 2020-04-09 NOTE — Telephone Encounter
Pt needs 3 weeks return appt

## 2020-04-09 NOTE — Progress Notes
There is no height or weight on file to calculate BMI.             Assessment and Plan:  1.  Bilateral ischemic optic neuropathy  Patient seen in hospital f/up found to have findings consistent with bilateral sequential ischemic optic neuropathy.  We had ordered coag labs and started patient on oral steroids 60 mg/day, looking for any improvement in her vision loss.          Today the patient's acuity and OCT imaging both have improved slightly on 60 mg prednisone.  Her coag work up was normal aside from slight AT3 elevation which is more suggestive of an acute phase reactant.    We suggest tapering off the prednisone as follows:  1.  50 mg prednisone daily x 4 days  2.  40 mg prednisone daily x 4 days  3.  30 mg prednisone dialy x 4 days  4.  20 mg prednisone daily x 4 days  5.  10 mg prednisone daily x 4 days, then stop.  She should continue the PPI until she's off the steroid.    2.  Acute lower lid internal hordeolum OD:  mebum expressed at slit lamp.  Patient instructed to use warm compress 2-3 day with massage.    Recheck in 3 weeks.

## 2020-04-28 NOTE — Progress Notes
There is no height or weight on file to calculate BMI.      OCT OPTIC NERVE           Optic Nerve  Right Eye  Findings location Inferior Optic nerve findings: Abnormal Thinning Progression has improved.     Left Eye  Progression has improved.     Notes  improved edema OD and OS                  Assessment and Plan:  04/09/20  1.  Bilateral ischemic optic neuropathy  Patient seen in hospital f/up found to have findings consistent with bilateral sequential ischemic optic neuropathy.  We had ordered coag labs and started patient on oral steroids 60 mg/day, looking for any improvement in her vision loss.    Today the patient's acuity and OCT imaging both have improved slightly on 60 mg prednisone.  Her coag work up was normal aside from slight AT3 elevation which is more suggestive of an acute phase reactant.    We suggest tapering off the prednisone as follows:  1.  50 mg prednisone daily x 4 days  2.  40 mg prednisone daily x 4 days  3.  30 mg prednisone dialy x 4 days  4.  20 mg prednisone daily x 4 days  5.  10 mg prednisone daily x 4 days, then stop.  She should continue the PPI until she's off the steroid.    2.  Acute lower lid internal hordeolum OD:  mebum expressed at slit lamp.  Patient instructed to use warm compress 2-3 day with massage.    Recheck in 3 weeks.    04/29/20 visit  Today the patient finished her steroid taper. She did schedule the sleep study and should have the results in the next week or so.      Her acuity has improved slightly from her last visit, and the OCT demonstrates improvement/resolution of the NFL edema.      We suggest RTC for refraction in 4-6 weeks with optometry or myself, and repeat OCT in 3 months.

## 2020-04-29 ENCOUNTER — Encounter: Admit: 2020-04-29 | Discharge: 2020-04-29 | Payer: BC Managed Care – HMO

## 2020-04-29 ENCOUNTER — Ambulatory Visit: Admit: 2020-04-29 | Discharge: 2020-04-29 | Payer: BC Managed Care – HMO

## 2020-04-29 DIAGNOSIS — E119 Type 2 diabetes mellitus without complications: Secondary | ICD-10-CM

## 2020-04-29 DIAGNOSIS — H47013 Ischemic optic neuropathy, bilateral: Secondary | ICD-10-CM

## 2020-04-29 DIAGNOSIS — E785 Hyperlipidemia, unspecified: Secondary | ICD-10-CM

## 2020-05-26 ENCOUNTER — Encounter: Admit: 2020-05-26 | Discharge: 2020-05-26 | Payer: BC Managed Care – HMO

## 2020-06-07 NOTE — Progress Notes
There is no height or weight on file to calculate BMI.             Assessment and Plan:  04/09/20  1. Bilateral ischemic optic neuropathy  Patient seen in hospital f/up found to have findings consistent with bilateral sequential ischemic optic neuropathy. We had ordered coag labs and started patient on oral steroids 60 mg/day, looking for any improvement in her vision loss.    Today the patient's acuity and OCT imaging both have improved slightly on 60 mg prednisone. Her coag work up was normal aside from slight AT3 elevation which is more suggestive of an acute phase reactant.    We suggest tapering off the prednisone as follows:  1. 50 mg prednisone daily x 4 days  2. 40 mg prednisone daily x 4 days  3. 30 mg prednisone dialy x 4 days  4. 20 mg prednisone daily x 4 days  5. 10 mg prednisone daily x 4 days, then stop. She should continue the PPI until she's off the steroid.    2. Acute lower lid internal hordeolum OD: mebum expressed at slit lamp. Patient instructed to use warm compress 2-3 day with massage.    Recheck in 3 weeks.    04/29/20 visit  Today the patient finished her steroid taper. She did schedule the sleep study and should have the results in the next week or so.      Her acuity has improved slightly from her last visit, and the OCT demonstrates improvement/resolution of the NFL edema.      We suggest RTC for refraction in 4-6 weeks with optometry or myself, and repeat OCT in 3 months.    ADDENDUM 05/20/20  Received and reviewed today results from home sleep study reporting moderate obstructive sleep apnea for which automatic positive airway pressure was recommended.          06/09/20 visit  The patient is waiting on CPAP to start treatment.  We shall give her today's MRx with a progressive bifocal, warning her about the adaptation period.  We suggest obtaining repeat OCT before the end of the year.

## 2020-06-09 ENCOUNTER — Encounter: Admit: 2020-06-09 | Discharge: 2020-06-09 | Payer: BC Managed Care – HMO

## 2020-06-09 ENCOUNTER — Ambulatory Visit: Admit: 2020-06-09 | Discharge: 2020-06-10 | Payer: BC Managed Care – HMO

## 2020-06-09 DIAGNOSIS — E785 Hyperlipidemia, unspecified: Secondary | ICD-10-CM

## 2020-06-09 DIAGNOSIS — H47013 Ischemic optic neuropathy, bilateral: Secondary | ICD-10-CM

## 2020-06-09 DIAGNOSIS — E119 Type 2 diabetes mellitus without complications: Secondary | ICD-10-CM

## 2020-08-03 NOTE — Progress Notes
Body mass index is 29.23 kg/m?.      OCT OPTIC NERVE           Optic Nerve  Right Eye  Findings location Temporal Optic nerve findings: Abnormal Thinning     Left Eye  Findings location Temporal Optic nerve findings: Abnormal Thinning                   Assessment and Plan:  04/09/20  1. ?Bilateral ischemic optic neuropathy  Patient seen in hospital f/up found to have findings consistent with bilateral sequential ischemic optic neuropathy. ?We had ordered coag labs and started patient on oral steroids 60 mg/day, looking for any improvement in her vision loss.  ?  Today the patient's acuity and OCT imaging both have improved slightly on 60 mg prednisone. ?Her coag work up was normal aside from slight AT3 elevation which is more suggestive of an acute phase reactant.  ?  We suggest tapering off the prednisone as follows:  1. ?50 mg prednisone daily x 4 days  2. ?40 mg prednisone daily x 4 days  3. ?30 mg prednisone dialy x 4 days  4. ?20 mg prednisone daily x 4 days  5. ?10 mg prednisone daily x 4 days, then stop. ?She should continue the PPI until she's off the steroid.  ?  2. ?Acute lower lid internal hordeolum OD: ?mebum expressed at slit lamp. ?Patient instructed to use warm compress 2-3 day with massage.  ?  Recheck in 3 weeks.  ?  04/29/20 visit  Today the patient finished her steroid taper. She did schedule the sleep study and should have the results in the next week or so.  ??  Her acuity has improved slightly from her last visit, and the OCT demonstrates improvement/resolution of the NFL edema. ?  ?  We suggest RTC for refraction in 4-6 weeks with optometry or myself, and repeat OCT in 3 months.  ?  ADDENDUM 05/20/20  Received and reviewed today results from home sleep study reporting moderate obstructive sleep apnea for which automatic positive airway pressure was recommended.  ?  06/09/20 visit  The patient is waiting on CPAP to start treatment.  We shall give her today's MRx with a progressive bifocal, warning her about the adaptation period.  We suggest obtaining repeat OCT before the end of the year.    08/04/20 visit    The patient today demonstrates markedly improved visual acuity in both eyes.  She does, however, demonstrate as well significant RNFL thinning temporally on OCT imaging.      We suggest continued use of CPAP and annual f/up either here or with her usual eye doctor.

## 2020-08-04 ENCOUNTER — Ambulatory Visit: Admit: 2020-08-04 | Discharge: 2020-08-05 | Payer: BC Managed Care – HMO

## 2020-08-04 ENCOUNTER — Encounter: Admit: 2020-08-04 | Discharge: 2020-08-04 | Payer: BC Managed Care – HMO

## 2020-08-04 DIAGNOSIS — E785 Hyperlipidemia, unspecified: Secondary | ICD-10-CM

## 2020-08-04 DIAGNOSIS — E119 Type 2 diabetes mellitus without complications: Secondary | ICD-10-CM

## 2020-08-05 DIAGNOSIS — H47013 Ischemic optic neuropathy, bilateral: Secondary | ICD-10-CM

## 2021-05-20 ENCOUNTER — Encounter: Admit: 2021-05-20 | Discharge: 2021-05-20 | Payer: BC Managed Care – HMO

## 2021-08-05 ENCOUNTER — Encounter: Admit: 2021-08-05 | Discharge: 2021-08-05 | Payer: BC Managed Care – HMO

## 2023-05-10 ENCOUNTER — Encounter: Admit: 2023-05-10 | Discharge: 2023-05-10 | Payer: BC Managed Care – HMO

## 2023-05-10 NOTE — Progress Notes
 Body mass index is 30.11 kg/m?.      OCT OPTIC NERVE          Optic Nerve  Right Eye  Findings location Temporal, Superior Optic nerve findings: Abnormal Thinning Progression has been stable.     Left Eye  Findings location Superior, Temporal Optic nerve findings: Abnormal Thinning Progression has been stable.               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  Cataracts: not visually significant.    We also shared with the patient literature from the [facility] and [facility] regarding the reported association of GLP-1 agonists and ischemic optic neuropathy.  We encouraged her to discuss with her PCP the use of such medications in light of her history of past bilateral NAION and the potential risk of further episodes.

## 2023-05-15 ENCOUNTER — Encounter: Admit: 2023-05-15 | Discharge: 2023-05-15 | Payer: PRIVATE HEALTH INSURANCE

## 2023-05-15 ENCOUNTER — Ambulatory Visit: Admit: 2023-05-15 | Discharge: 2023-05-16 | Payer: PRIVATE HEALTH INSURANCE

## 2023-05-15 DIAGNOSIS — H47013 Ischemic optic neuropathy, bilateral: Secondary | ICD-10-CM
